# Patient Record
Sex: Female | Born: 1995 | Race: White | Hispanic: No | Marital: Single | Smoking: Never smoker
Health system: Southern US, Community
[De-identification: ages and names within clinical notes are randomized; demographics above are authoritative.]

## PROBLEM LIST (undated history)

## (undated) DIAGNOSIS — T7840XA Allergy, unspecified, initial encounter: Secondary | ICD-10-CM

## (undated) HISTORY — DX: Allergy, unspecified, initial encounter: T78.40XA

---
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
## 2016-01-18 ENCOUNTER — Telehealth: Payer: Self-pay | Admitting: Family Medicine

## 2016-01-18 ENCOUNTER — Encounter: Payer: Self-pay | Admitting: Family Medicine

## 2016-01-18 DIAGNOSIS — N390 Urinary tract infection, site not specified: Secondary | ICD-10-CM | POA: Insufficient documentation

## 2016-01-18 MED ORDER — NITROFURANTOIN MONOHYD MACRO 100 MG PO CAPS: 100.0000 mg | ORAL_CAPSULE | Freq: Two times a day (BID) | ORAL | 0 refills | Status: AC

## 2016-01-18 NOTE — Progress Notes (Signed)
We are sorry that you are not feeling well.  Here is how we plan to help!  Based on what you shared with me it looks like you most likely have a simple urinary tract infection.  A UTI (Urinary Tract Infection) is a bacterial infection of the bladder.  Most cases of urinary tract infections are simple to treat but a key part of your care is to encourage you to drink plenty of fluids and watch your symptoms carefully.  I have prescribed Macrobid 2 times a day x 7 days.  Your symptoms should gradually improve. Call us if the burning in your urine worsens, you develop worsening fever, back pain or pelvic pain or if your symptoms do not resolve after completing the antibiotic.  Urinary tract infections can be prevented by drinking plenty of water to keep your body hydrated.  Also be sure when you wipe, wipe from front to back and don't hold it in!  If possible, empty your bladder every 4 hours.  Your e-visit answers were reviewed by a board certified advanced clinical practitioner to complete your personal care plan.  Depending on the condition, your plan could have included both over the counter or prescription medications.  If there is a problem please reply  once you have received a response from your provider.  Your safety is important to us.  If you have drug allergies check your prescription carefully.    You can use MyChart to ask questions about today's visit, request a non-urgent call back, or ask for a work or school excuse for 24 hours related to this e-Visit. If it has been greater than 24 hours you will need to follow up with your provider, or enter a new e-Visit to address those concerns.   You will get an e-mail in the next two days asking about your experience.  I hope that your e-visit has been valuable and will speed your recovery. Thank you for using e-visits.

## 2016-01-29 ENCOUNTER — Encounter: Payer: Self-pay | Admitting: Nurse Practitioner

## 2016-01-29 ENCOUNTER — Ambulatory Visit (INDEPENDENT_AMBULATORY_CARE_PROVIDER_SITE_OTHER): Payer: 59 | Admitting: Nurse Practitioner

## 2016-01-29 VITALS — BP 112/72 | Ht 69.0 in | Wt 148.8 lb

## 2016-01-29 DIAGNOSIS — Z3041 Encounter for surveillance of contraceptive pills: Secondary | ICD-10-CM

## 2016-01-29 MED ORDER — NORGESTIM-ETH ESTRAD TRIPHASIC 0.18/0.215/0.25 MG-35 MCG PO TABS: 1.0000 | ORAL_TABLET | Freq: Every day | ORAL | 3 refills | Status: DC

## 2016-01-29 MED FILL — TRI-PREVIFEM TABLET: 0.18/0.215/ | 84 days supply | Qty: 84 | Fill #0

## 2016-01-29 NOTE — Progress Notes (Signed)
Subjective:  Presents for recheck for her birth control pills. Regular menses, normal flow. Denies any missed pills. No new sexual partners. Overall healthy diet. Limited activity.  Objective:   BP 112/72   Ht 5\' 9"  (1.753 m)   Wt 148 lb 12.8 oz (67.5 kg)   BMI 21.97 kg/m  NAD. Alert, oriented. Lungs clear. Heart regular rate rhythm.  Assessment: Encounter for surveillance of contraceptive pills  Plan:  Meds ordered this encounter  Medications  . Norgestimate-Ethinyl Estradiol Triphasic (TRINESSA, 28,) 0.18/0.215/0.25 MG-35 MCG tablet    Sig: Take 1 tablet by mouth daily.    Dispense:  3 Package    Refill:  3    Order Specific Question:   Supervising Provider    Answer:   Merlyn AlbertLUKING, WILLIAM S [[id number]]   Increase activity. Discussed safe sex issues. Defers need for STD testing. Return in about 1 year (around 01/28/2017) for physical.

## 2016-04-30 DIAGNOSIS — H5213 Myopia, bilateral: Secondary | ICD-10-CM | POA: Diagnosis not present

## 2016-04-30 MED FILL — TRI-PREVIFEM TABLET: 0.18/0.215/ | 84 days supply | Qty: 84 | Fill #1

## 2016-07-28 MED FILL — TRI-PREVIFEM TABLET: 0.18/0.215/ | 84 days supply | Qty: 84 | Fill #2

## 2016-08-01 DIAGNOSIS — S93431A Sprain of tibiofibular ligament of right ankle, initial encounter: Secondary | ICD-10-CM | POA: Diagnosis not present

## 2016-10-03 MED FILL — TRI-PREVIFEM TABLET: 0.18/0.215/ | 84 days supply | Qty: 84 | Fill #3

## 2017-01-20 ENCOUNTER — Telehealth: Payer: Self-pay | Admitting: Family Medicine

## 2017-01-20 ENCOUNTER — Other Ambulatory Visit: Payer: Self-pay | Admitting: Nurse Practitioner

## 2017-01-20 MED FILL — NORG-EE 0.18-0.215-0.25/0.0: 0.18/0.215/ | 84 days supply | Qty: 84 | Fill #0

## 2017-01-20 NOTE — Telephone Encounter (Signed)
Requesting refill on birth control to [facility].  Mom wants to make sure this is done ASAP because she will be out on Sunday and she has to send to her in [place].

## 2017-01-20 NOTE — Telephone Encounter (Signed)
Med was sent in by carolyn 2 mins before this call came in. Pt notified refill sent.

## 2017-01-30 ENCOUNTER — Ambulatory Visit (INDEPENDENT_AMBULATORY_CARE_PROVIDER_SITE_OTHER): Payer: 59 | Admitting: Nurse Practitioner

## 2017-01-30 ENCOUNTER — Telehealth: Payer: Self-pay | Admitting: *Deleted

## 2017-01-30 ENCOUNTER — Encounter: Payer: Self-pay | Admitting: Nurse Practitioner

## 2017-01-30 VITALS — BP 122/78 | Ht 69.75 in | Wt 151.0 lb

## 2017-01-30 DIAGNOSIS — Z113 Encounter for screening for infections with a predominantly sexual mode of transmission: Secondary | ICD-10-CM

## 2017-01-30 DIAGNOSIS — Z0000 Encounter for general adult medical examination without abnormal findings: Secondary | ICD-10-CM | POA: Diagnosis not present

## 2017-01-30 MED ORDER — NORETHIN-ETH ESTRAD-FE BIPHAS 1 MG-10 MCG / 10 MCG PO TABS: 1.0000 | ORAL_TABLET | Freq: Every day | ORAL | 2 refills | Status: DC

## 2017-01-30 NOTE — Telephone Encounter (Signed)
Fax from [facility]. Pt's insurance plan now requires sto therapy before covering LoLoestrin fe 1/10. Please see form. On carolyn's desk. Pt was seen today.

## 2017-01-30 NOTE — Telephone Encounter (Signed)
Please call on Monday and let her know we have to try another generic. She must try 2 within a year. There is another generic that is the same medication we talked about but a different dose. Let me know if she wants to try this.

## 2017-01-30 NOTE — Progress Notes (Signed)
   Subjective:    Patient ID: Dana Brewer, female    DOB: December 29, 1995, 21 y.o.   MRN: [id number]  HPI presents for her wellness exam. Overall healthy diet. Does not eat red meat. No missed birth control pills. Regular cycles, normal flow. Has had her current partner for the past 2 years. Has noticed some issues with lubrication and libido which she thinks may be related to her current birth control pill or possibly the generic version. Also under a lot more stress with school, currently in college courses. Regular vision and dental exams. Active lifestyle.  Depression screen PHQ 2/9 01/30/2017  Decreased Interest 0  Down, Depressed, Hopeless 0  PHQ - 2 Score 0      Review of Systems  Constitutional: Negative for activity change, appetite change and fatigue.  HENT: Negative for dental problem, ear pain, sinus pressure and sore throat.   Respiratory: Negative for cough, chest tightness, shortness of breath and wheezing.   Cardiovascular: Negative for chest pain.  Gastrointestinal: Negative for abdominal distention, abdominal pain, constipation, diarrhea, nausea and vomiting.  Genitourinary: Negative for difficulty urinating, dysuria, enuresis, frequency, genital sores, menstrual problem, pelvic pain, urgency and vaginal discharge.       Objective:   Physical Exam  Constitutional: She is oriented to person, place, and time. She appears well-developed. No distress.  HENT:  Right Ear: External ear normal.  Left Ear: External ear normal.  Mouth/Throat: Oropharynx is clear and moist.  Neck: Normal range of motion. Neck supple. No tracheal deviation present. No thyromegaly present.  Cardiovascular: Normal rate, regular rhythm and normal heart sounds.  Exam reveals no gallop.   No murmur heard. Pulmonary/Chest: Effort normal and breath sounds normal. Right breast exhibits no inverted nipple, no mass, no skin change and no tenderness. Left breast exhibits no inverted nipple, no mass, no  skin change and no tenderness. Breasts are symmetrical.  Abdominal: Soft. She exhibits no distension. There is no tenderness.  Genitourinary:  Genitourinary Comments: Defers GU exam, denies any problems.  Lymphadenopathy:    She has no cervical adenopathy.  Neurological: She is alert and oriented to person, place, and time.  Skin: Skin is warm and dry. No rash noted.  Psychiatric: She has a normal mood and affect. Her behavior is normal.  Vitals reviewed.  breast exam: Very dense tissue with multiple fine nodularity more so on the right, no dominant masses. Axillae no adenopathy.        Assessment & Plan:  Routine general medical examination at a health care facility  Screen for STD (sexually transmitted disease) - Plan: Chlamydia/Gonococcus/Trichomonas, NAA  Discussed options for decreased lubrication and libido. Switch to lo Loestrin start with next pack of pills. Use backup method first pack. Contact office if any problems with new pill. Reviewed safe sex issues. STD testing pending. Declines flu vaccine. Return in about 1 year (around 01/30/2018) for physical.

## 2017-02-01 LAB — CHLAMYDIA/GONOCOCCUS/TRICHOMONAS, NAA
CHLAMYDIA BY NAA: NEGATIVE
GONOCOCCUS BY NAA: NEGATIVE
TRICH VAG BY NAA: NEGATIVE

## 2017-02-01 LAB — SPECIMEN STATUS REPORT

## 2017-02-03 ENCOUNTER — Other Ambulatory Visit: Payer: Self-pay | Admitting: Nurse Practitioner

## 2017-02-03 MED ORDER — NORETHINDRONE ACET-ETHINYL EST 1-20 MG-MCG PO TABS: 1.0000 | ORAL_TABLET | Freq: Every day | ORAL | 11 refills | Status: DC

## 2017-02-03 NOTE — Telephone Encounter (Signed)
Sent to [facility]. Start with next pack. Use back up method such as condoms first pack. Call back if any problems.

## 2017-02-03 NOTE — Telephone Encounter (Signed)
Spoke with patient and informed her per Nathaneil Canary- Sent to [place]. Start with next pack. Use back up method such as condoms first pack. Call back if any problems. Patient verbalized understanding.

## 2017-02-03 NOTE — Telephone Encounter (Signed)
Spoke with patient and informed her that her insurance now requires step therapy before they will cover your LoLoestrin fe1/10.  Informed her per Nathaneil Canary that there is a generic that is the same medication that was discussed but a different dose. Patient verbalized understanding and stated that she would like to try that other medication. Please advise?

## 2017-02-03 NOTE — Telephone Encounter (Signed)
Left message return call 02/03/17 

## 2017-02-12 MED FILL — LO LOESTRIN FE 1-10 TABLET: 1 MG-10 MCG | 28 days supply | Qty: 28 | Fill #0

## 2017-05-14 ENCOUNTER — Telehealth: Payer: Self-pay | Admitting: Family Medicine

## 2017-05-14 DIAGNOSIS — Z0100 Encounter for examination of eyes and vision without abnormal findings: Secondary | ICD-10-CM

## 2017-05-14 NOTE — Telephone Encounter (Signed)
Once again, crazy, brendale you may want to explore if this is accurate this will mean a lot more work for us if true, lets do if needed

## 2017-05-14 NOTE — Telephone Encounter (Signed)
Pt needs referral documented for ophthalmology for a routine eye exam (pt has [facility] plan & referrals are required)  Please initiate in system so that I may process  [facility] [phone] ID# [id number], Group# [id number] (per pt - no card in system)

## 2017-05-15 NOTE — Telephone Encounter (Signed)
Referral ordered in Epic. 

## 2017-11-02 ENCOUNTER — Telehealth: Payer: No Typology Code available for payment source | Admitting: Family

## 2017-11-02 DIAGNOSIS — A499 Bacterial infection, unspecified: Secondary | ICD-10-CM

## 2017-11-02 DIAGNOSIS — N390 Urinary tract infection, site not specified: Secondary | ICD-10-CM | POA: Diagnosis not present

## 2017-11-02 MED ORDER — NITROFURANTOIN MONOHYD MACRO 100 MG PO CAPS: 100.0000 mg | ORAL_CAPSULE | Freq: Two times a day (BID) | ORAL | 0 refills | Status: DC

## 2017-11-02 NOTE — Progress Notes (Signed)

## 2017-12-28 ENCOUNTER — Telehealth: Payer: Self-pay | Admitting: Family Medicine

## 2017-12-28 MED ORDER — NORETHINDRONE ACET-ETHINYL EST 1-20 MG-MCG PO TABS: 1.0000 | ORAL_TABLET | Freq: Every day | ORAL | 3 refills | Status: DC

## 2017-12-28 NOTE — Telephone Encounter (Signed)
Pt needs birth control refilled - Has physical scheduled for 02/02/18 here with Lillia Abed  Can we send in a refill to cover pt until her physical?  Please advise & notify pt through MyChart - her cell service is only for texting at this time    [facility]

## 2017-12-28 NOTE — Telephone Encounter (Signed)
Please advise 

## 2017-12-28 NOTE — Telephone Encounter (Signed)
I called left message asked that she r/c. Medication sent in to the pharmacy requested.

## 2017-12-28 NOTE — Telephone Encounter (Signed)
She may have 3 refills. 

## 2017-12-28 NOTE — Telephone Encounter (Signed)
Patient notified

## 2018-02-02 ENCOUNTER — Encounter: Payer: Self-pay | Admitting: Family Medicine

## 2018-02-02 ENCOUNTER — Ambulatory Visit (INDEPENDENT_AMBULATORY_CARE_PROVIDER_SITE_OTHER): Payer: No Typology Code available for payment source | Admitting: Family Medicine

## 2018-02-02 VITALS — BP 122/86 | HR 98 | Ht 68.0 in | Wt 156.8 lb

## 2018-02-02 DIAGNOSIS — Z113 Encounter for screening for infections with a predominantly sexual mode of transmission: Secondary | ICD-10-CM | POA: Diagnosis not present

## 2018-02-02 DIAGNOSIS — Z01419 Encounter for gynecological examination (general) (routine) without abnormal findings: Secondary | ICD-10-CM | POA: Diagnosis not present

## 2018-02-02 DIAGNOSIS — N926 Irregular menstruation, unspecified: Secondary | ICD-10-CM

## 2018-02-02 LAB — POCT URINE PREGNANCY: PREG TEST UR: NEGATIVE

## 2018-02-02 MED ORDER — NORETHINDRONE ACET-ETHINYL EST 1-20 MG-MCG PO TABS: 1.0000 | ORAL_TABLET | Freq: Every day | ORAL | 9 refills | Status: DC

## 2018-02-02 NOTE — Progress Notes (Signed)
Subjective:    Patient ID: Dana Brewer, female    DOB: 01-21-1996, 22 y.o.   MRN: [id number]  HPI The patient comes in today for a wellness visit.  Goes to [employer], graduates in May.   A review of their health history was completed. A review of medications was also completed.  Any needed refills; Birth control   Eating habits: Health Conscious  Falls/  MVA accidents in past few months: none  Regular exercise: Volleyball, Facilities manager pt sees on regular basis: No specialists  Preventative health issues were discussed. Regular dental and vision.  Additional concerns: Irregular Periods, pt declined flu shot - with last cycle has had bleeding in between her cycles, LMP 12/22/17, has not had regular cycle since then, scheduled to have a cycle on 01/21/18. Reports compliance with OCPs, has had regular cycles previously. Sexually active with long term partner, no other form of birth control used. Denies vaginal discharge or itching.   Review of Systems  Constitutional: Negative for chills, fatigue, fever and unexpected weight change.  HENT: Negative for congestion, ear pain, sinus pressure, sinus pain and sore throat.   Eyes: Negative for discharge and visual disturbance.  Respiratory: Negative for cough, shortness of breath and wheezing.   Cardiovascular: Negative for chest pain and leg swelling.  Gastrointestinal: Negative for abdominal pain, blood in stool, constipation, diarrhea, nausea and vomiting.  Genitourinary: Positive for menstrual problem. Negative for difficulty urinating and hematuria.  Skin: Negative for color change.  Neurological: Negative for dizziness, weakness, light-headedness and headaches.  Hematological: Negative for adenopathy.  Psychiatric/Behavioral: Negative for suicidal ideas.  All other systems reviewed and are negative.      Objective:   Physical Exam  Constitutional: She is oriented to person, place, and time.  She appears well-developed and well-nourished. No distress.  HENT:  Head: Normocephalic and atraumatic.  Right Ear: Tympanic membrane normal.  Left Ear: Tympanic membrane normal.  Nose: Nose normal.  Mouth/Throat: Uvula is midline and oropharynx is clear and moist.  Eyes: Pupils are equal, round, and reactive to light. Conjunctivae and EOM are normal. Right eye exhibits no discharge. Left eye exhibits no discharge.  Neck: Neck supple. No thyromegaly present.  Cardiovascular: Normal rate, regular rhythm and normal heart sounds.  No murmur heard. Pulmonary/Chest: Effort normal and breath sounds normal. No respiratory distress. She has no wheezes. Right breast exhibits no inverted nipple, no mass, no nipple discharge, no skin change and no tenderness. Left breast exhibits no inverted nipple, no mass, no nipple discharge, no skin change and no tenderness.  Abdominal: Soft. Bowel sounds are normal. She exhibits no distension and no mass. There is no tenderness.  Genitourinary: Vagina normal and uterus normal. There is no rash, tenderness, lesion or injury on the right labia. There is no rash, tenderness, lesion or injury on the left labia. Cervix exhibits discharge and friability. Cervix exhibits no motion tenderness. Right adnexum displays no mass and no tenderness. Left adnexum displays no mass and no tenderness.  Musculoskeletal: She exhibits no edema or deformity.  Lymphadenopathy:    She has no cervical adenopathy.  Neurological: She is alert and oriented to person, place, and time. Coordination normal.  Skin: Skin is warm and dry.  Psychiatric: She has a normal mood and affect. Her behavior is normal. Judgment and thought content normal.  Nursing note and vitals reviewed.     Assessment & Plan:  1. Well woman exam Adult wellness-complete.wellness physical was conducted today.  Importance of diet and exercise were discussed in detail.  In addition to this a discussion regarding safety was  also covered. We also reviewed over immunizations and gave recommendations regarding current immunization needed for age.   -Declines HPV vaccine, info given  -Declines Flu In addition to this additional areas were also touched on including: Preventative health exams needed:  Pap Smear done today  Patient was advised yearly wellness exam  Screen for STD (sexually transmitted disease) - Will send urine for GC/Chlamydia/Trich testing; Pap IG w/ reflex to HPV when ASC-U  2. Irregular menses - Plan: POCT urine pregnancy  Urine pregnancy negative today. Awaiting results of GC/Chlamydia/Trich screening, will notify patient of results. Will continue with current OCP, refills given, and pt will notify us if she has continued BTB or irregular cycles while on the pill. At that time may consider a different OCP or patient may be interested in [facility], which we would place a referral for at that time. As attending physician to this patient visit, this patient was seen in conjunction with the nurse practitioner.  The history,physical and treatment plan was reviewed with the nurse practitioner and pertinent findings were verified with the patient.  Also the treatment plan was reviewed with the patient while they were present. [facility]

## 2018-02-05 LAB — PAP IG W/ RFLX HPV ASCU: PAP SMEAR COMMENT: 0

## 2018-02-06 LAB — CHLAMYDIA/GONOCOCCUS/TRICHOMONAS, NAA
Chlamydia by NAA: NEGATIVE
Gonococcus by NAA: NEGATIVE
Trich vag by NAA: NEGATIVE

## 2018-04-20 ENCOUNTER — Telehealth: Payer: No Typology Code available for payment source | Admitting: Physician Assistant

## 2018-04-20 DIAGNOSIS — R319 Hematuria, unspecified: Secondary | ICD-10-CM

## 2018-04-20 DIAGNOSIS — N390 Urinary tract infection, site not specified: Secondary | ICD-10-CM | POA: Diagnosis not present

## 2018-04-20 MED ORDER — CEPHALEXIN 500 MG PO CAPS: 500.0000 mg | ORAL_CAPSULE | Freq: Two times a day (BID) | ORAL | 0 refills | Status: AC

## 2018-04-20 NOTE — Progress Notes (Signed)
We are sorry that you are not feeling well.  Here is how we plan to help!  Based on what you shared with me it looks like you most likely have a simple urinary tract infection.  A UTI (Urinary Tract Infection) is a bacterial infection of the bladder.  Most cases of urinary tract infections are simple to treat but a key part of your care is to encourage you to drink plenty of fluids and watch your symptoms carefully.  I have prescribed Keflex 500 mg twice a day for 7 days.  Your symptoms should gradually improve. Call us if the burning in your urine worsens, you develop worsening fever, back pain or pelvic pain or if your symptoms do not resolve after completing the antibiotic.  Urinary tract infections can be prevented by drinking plenty of water to keep your body hydrated.  Also be sure when you wipe, wipe from front to back and don't hold it in!  If possible, empty your bladder every 4 hours.  Your e-visit answers were reviewed by a board certified advanced clinical practitioner to complete your personal care plan.  Depending on the condition, your plan could have included both over the counter or prescription medications.  If there is a problem please reply once you have received a response from your provider.  Your safety is important to us.  If you have drug allergies check your prescription carefully.    You can use MyChart to ask questions about today's visit, request a non-urgent call back, or ask for a work or school excuse for 24 hours related to this e-Visit. If it has been greater than 24 hours you will need to follow up with your provider, or enter a new e-Visit to address those concerns.   You will get an e-mail in the next two days asking about your experience.  I hope that your e-visit has been valuable and will speed your recovery. Thank you for using e-visits.    ===View-only below this line===   ----- Message -----    From: Dana MediaJennifer N Brewer    Sent: 04/20/2018  9:44  AM EST      To: E-Visit Mailing List Subject: E-Visit Submission: Urinary Problems  E-Visit Submission: Urinary Problems --------------------------------  Question: Which of the following are you experiencing? Answer:   Pain while passing urine  Question: When you have pain when passing urine, which of these apply? Answer:   The pain feels like it is on the inside  Question: Are you able to pass urine? Answer:   Yes, I can pass urine with difficulty  Question: How long have you had pain or difficulty passing urine? Answer:   Two days or less  Question: Do you have a fever? Answer:   I do not know  Question: Do you have any of the following? Answer:   I have back pain with this illness  Question: Do you have an exaggerated sensation of the need to pass urine? Answer:   Yes, the sensation is exaggerated  Question: Do you have the urge to urinate more of less frequently than normal? Answer:   The same as normal  Question: What does your urine look like? Answer:   It is clear  Question: Do you have any of the following? Answer:   Dark red or dark brown urine            An unusual smell  Question: Do you have any of the following? Answer:   No  discharge  Question: Do you have any sores on your genitals? Answer:   No  Question: Do you have any history of kidney dysfunction or kidney problems? Answer:   No  Question: Within the past 3 months, have you had any surgery on your kidneys or bladder, or have you had a tube inserted to collect your urine? Answer:   No, I have never had either  Question: Have you had similar symptoms in the past? Answer:   Yes, I have had similar symptoms more than once before  Question: If you had similar symptoms in the past, did any of the following work? Answer:   Pills for urine infection  Question: Please list any additional comments  Answer:     Question: Please list your medication allergies that you may have ? (If 'none' , please  list as 'none') Answer:   none  Question: Are you pregnant? Answer:   I am confident that I am not pregnant  Question: Are you breastfeeding? Answer:   No

## 2018-11-01 ENCOUNTER — Telehealth: Payer: No Typology Code available for payment source | Admitting: Physician Assistant

## 2018-11-01 ENCOUNTER — Encounter: Payer: Self-pay | Admitting: Physician Assistant

## 2018-11-01 DIAGNOSIS — N390 Urinary tract infection, site not specified: Secondary | ICD-10-CM

## 2018-11-01 MED ORDER — NITROFURANTOIN MONOHYD MACRO 100 MG PO CAPS: 100.0000 mg | ORAL_CAPSULE | Freq: Two times a day (BID) | ORAL | 0 refills | Status: DC

## 2018-11-01 NOTE — Progress Notes (Signed)
We are sorry that you are not feeling well.  Here is how we plan to help!  Based on what you shared with me it looks like you most likely have a simple urinary tract infection.  A UTI (Urinary Tract Infection) is a bacterial infection of the bladder.  Most cases of urinary tract infections are simple to treat but a key part of your care is to encourage you to drink plenty of fluids and watch your symptoms carefully.  I have prescribed MacroBid 100 mg twice a day for 5 days.  Your symptoms should gradually improve. Call us if the burning in your urine worsens, you develop worsening fever, back pain or pelvic pain or if your symptoms do not resolve after completing the antibiotic.  Urinary tract infections can be prevented by drinking plenty of water to keep your body hydrated.  Also be sure when you wipe, wipe from front to back and don't hold it in!  If possible, empty your bladder every 4 hours.  As per your reply, you have denied new onset back pain with this UTI. New onset back pain with UTI symptoms can indicate kidney infection which would require different treatment that outlined here.   Your e-visit answers were reviewed by a board certified advanced clinical practitioner to complete your personal care plan.  Depending on the condition, your plan could have included both over the counter or prescription medications.  If there is a problem please reply  once you have received a response from your provider.  Your safety is important to [place].  If you have drug allergies check your prescription carefully.    You can use MyChart to ask questions about today's visit, request a non-urgent call back, or ask for a work or school excuse for 24 hours related to this e-Visit. If it has been greater than 24 hours you will need to follow up with your provider, or enter a new e-Visit to address those concerns.   You will get an e-mail in the next two days asking about your experience.  I hope that your  e-visit has been valuable and will speed your recovery. Thank you for using e-visits.   I spent 5-10 minutes on review and completion of this note- Lacy Duverney [facility]

## 2018-12-13 ENCOUNTER — Encounter: Payer: Self-pay | Admitting: Physician Assistant

## 2018-12-13 ENCOUNTER — Telehealth: Payer: No Typology Code available for payment source | Admitting: Physician Assistant

## 2018-12-13 ENCOUNTER — Other Ambulatory Visit: Payer: Self-pay

## 2018-12-13 ENCOUNTER — Ambulatory Visit
Admission: EM | Admit: 2018-12-13 | Discharge: 2018-12-13 | Disposition: A | Discharge status: Home / Self Care | Payer: No Typology Code available for payment source | Attending: Emergency Medicine | Admitting: Emergency Medicine

## 2018-12-13 DIAGNOSIS — Z87440 Personal history of urinary (tract) infections: Secondary | ICD-10-CM | POA: Diagnosis present

## 2018-12-13 DIAGNOSIS — N390 Urinary tract infection, site not specified: Secondary | ICD-10-CM

## 2018-12-13 DIAGNOSIS — N3001 Acute cystitis with hematuria: Secondary | ICD-10-CM | POA: Insufficient documentation

## 2018-12-13 LAB — POCT URINALYSIS DIP (MANUAL ENTRY)
Bilirubin, UA: NEGATIVE
Glucose, UA: NEGATIVE mg/dL
Ketones, POC UA: NEGATIVE mg/dL
Nitrite, UA: POSITIVE — AB
Protein Ur, POC: 30 mg/dL — AB
Spec Grav, UA: >=1.03 — AB (ref 1.010–1.025)
Urobilinogen, UA: 1 E.U./dL
pH, UA: 6.5 (ref 5.0–8.0)

## 2018-12-13 LAB — POCT URINE PREGNANCY: Preg Test, Ur: NEGATIVE

## 2018-12-13 MED ORDER — CEPHALEXIN 500 MG PO CAPS: 500.0000 mg | ORAL_CAPSULE | Freq: Two times a day (BID) | ORAL | 0 refills | Status: AC

## 2018-12-13 NOTE — ED Triage Notes (Signed)
uti symptoms began 2 days ago

## 2018-12-13 NOTE — Progress Notes (Signed)
Based on what you shared with me, I feel your condition warrants further evaluation and I recommend that you be seen for a face to face office visit.  NOTE: If you entered your credit card information for this eVisit, you will not be charged. You may see a "hold" on your card for the $35 but that hold will drop off and you will not have a charge processed.  Ms. Dana Brewer,  Review of your records shows that you had similar symptoms recently. Recurrent UTIs warrant for a face to face evaluation. Please follow up with your doctor.  If you are having a true medical emergency please call 911.     For an urgent face to face visit, [place] has five urgent care centers for your convenience:   [email or web address] to reserve your spot online an avoid wait times  [place], [place], [place] [address] *Just off [address], across the road from [place] hours of operation: Monday-Friday, 12 PM to 6 PM  Closed Saturday & Sunday    . [facility]    [phone]                  Get Driving Directions  [address], [place] [address] . 10 am to 8 pm Monday-Friday . 12 pm to 8 pm Saturday-Sunday   . [facility] at [place]                  Get Driving Directions  [address], [place], [place] [address] . 8 am to 8 pm Monday-Friday . 9 am to 6 pm Saturday . 11 am to 6 pm Sunday   . [facility] at [place]                  Get Driving Directions   [address], [place] [address] . 8 am to 8 pm Monday-Friday . 8 am to 4 pm Saturday-Sunday    . [facility] at [place]                    Get Driving Directions  [phone]  [address] [place], [place] [address]  . Monday-Friday, 12 PM to 6 PM    Your e-visit answers were reviewed by a board  certified advanced clinical practitioner to complete your personal care plan.  Thank you for using e-Visits. I spent 5-10 minutes on review and completion of this note- Lacy Duverney [facility]

## 2018-12-13 NOTE — ED Provider Notes (Signed)
[facility]    CSN: [id number] Arrival date & time: 12/13/18  1544      History   Chief Complaint Chief Complaint  Patient presents with  . Urinary Tract Infection    HPI Dana Brewer is a 23 y.o. female.   HPI Dana Brewer is a 23 y.o. female presenting to UC with c/o 2 days of urinary frequency, urgency and dysuria. Mild bladder spasms.  Denies fever, chills, n/v/d. Denies vaginal symptoms. No concern for STIs. Last UTI was about 1 month ago. She was on Macrobid at that time. She has had several UTIs over the last few months. She attempted to do an e-visit today but due to recurrent infections, she was advised to have a face-to-face exam.  Past Medical History:  Diagnosis Date  . Allergy     Patient Active Problem List   Diagnosis Date Noted  . UTI (urinary tract infection) 01/18/2016  . Menorrhagia 01/28/2013    History reviewed. No pertinent surgical history.  OB History   No obstetric history on file.      Home Medications    Prior to Admission medications   Medication Sig Start Date End Date Taking? Authorizing Provider  cephALEXin (KEFLEX) 500 MG capsule Take 1 capsule (500 mg total) by mouth 2 (two) times daily for 7 days. 12/13/18 12/20/18  Lurene ShadowPhelps, Tisheena Maguire O, PA-C  nitrofurantoin, macrocrystal-monohydrate, (MACROBID) 100 MG capsule Take 1 capsule (100 mg total) by mouth 2 (two) times daily. 11/01/18   Demetrio Lappingsman, Sahar M, PA-C  norethindrone-ethinyl estradiol (MICROGESTIN,JUNEL,LOESTRIN) 1-20 MG-MCG tablet Take 1 tablet by mouth daily. 02/02/18   Jeannine BogaWeekley, Lindsay, NP    Family History History reviewed. No pertinent family history.  Social History Social History   Tobacco Use  . Smoking status: Never Smoker  . Smokeless tobacco: Never Used  Substance Use Topics  . Alcohol use: Not on file  . Drug use: Not on file     Allergies   Patient has no known allergies.   Review of Systems Review of Systems  Constitutional: Negative for  chills and fever.  Gastrointestinal: Negative for abdominal pain, diarrhea, nausea and vomiting.  Genitourinary: Positive for dysuria, frequency and urgency. Negative for pelvic pain.  Musculoskeletal: Negative for back pain.  Neurological: Negative for dizziness, light-headedness and headaches.     Physical Exam Triage Vital Signs ED Triage Vitals  Enc Vitals Group     BP 12/13/18 1555 120/79     Pulse Rate 12/13/18 1555 80     Resp 12/13/18 1555 18     Temp 12/13/18 1555 98 F (36.7 C)     Temp src --      SpO2 12/13/18 1555 96 %     Weight --      Height --      Head Circumference --      Peak Flow --      Pain Score 12/13/18 1554 5     Pain Loc --      Pain Edu? --      Excl. in GC? --    No data found.  Updated Vital Signs BP 120/79   Pulse 80   Temp 98 F (36.7 C)   Resp 18   LMP 11/19/2018 (Approximate)   SpO2 96%   Visual Acuity Right Eye Distance:   Left Eye Distance:   Bilateral Distance:    Right Eye Near:   Left Eye Near:    Bilateral Near:  Physical Exam Vitals signs and nursing note reviewed.  Constitutional:      General: She is not in acute distress.    Appearance: Normal appearance. She is well-developed. She is not ill-appearing.  HENT:     Head: Normocephalic and atraumatic.     Mouth/Throat:     Mouth: Mucous membranes are moist.  Neck:     Musculoskeletal: Normal range of motion.  Cardiovascular:     Rate and Rhythm: Normal rate and regular rhythm.  Pulmonary:     Effort: Pulmonary effort is normal.     Breath sounds: Normal breath sounds.  Abdominal:     General: There is no distension.     Palpations: Abdomen is soft.     Tenderness: There is no abdominal tenderness. There is no right CVA tenderness or left CVA tenderness.  Musculoskeletal: Normal range of motion.  Skin:    General: Skin is warm and dry.  Neurological:     Mental Status: She is alert and oriented to person, place, and time.  Psychiatric:         Behavior: Behavior normal.      UC Treatments / Results  Labs (all labs ordered are listed, but only abnormal results are displayed) Labs Reviewed  POCT URINALYSIS DIP (MANUAL ENTRY) - Abnormal; Notable for the following components:      Result Value   Color, UA straw (*)    Clarity, UA cloudy (*)    Spec Grav, UA >=1.030 (*)    Blood, UA moderate (*)    Protein Ur, POC =30 (*)    Nitrite, UA Positive (*)    Leukocytes, UA Moderate (2+) (*)    All other components within normal limits  URINE CULTURE  POCT URINE PREGNANCY    EKG   Radiology No results found.  Procedures Procedures (including critical care time)  Medications Ordered in UC Medications - No data to display  Initial Impression / Assessment and Plan / UC Course  I have reviewed the triage vital signs and the nursing notes.  Pertinent labs & imaging results that were available during my care of the patient were reviewed by me and considered in my medical decision making (see chart for details).     UA c/w UTI Culture sent Pt was most recently on macrobid, will start pt on Keflex while culture pending AVS provided  Final Clinical Impressions(s) / UC Diagnoses   Final diagnoses:  Acute cystitis with hematuria  History of recurrent UTIs     Discharge Instructions      Please take your antibiotic as prescribed. A urine culture has been sent to check the severity of your urinary infection and to determine if you are on the most appropriate antibiotic. The results should come back within 2-3 days and you will be notified if a medication change is indicated based off these results.   Please stay well hydrated and follow up with your family doctor in 1 week if not improving, sooner if worsening.     ED Prescriptions    Medication Sig Dispense Auth. Provider   cephALEXin (KEFLEX) 500 MG capsule Take 1 capsule (500 mg total) by mouth 2 (two) times daily for 7 days. 14 capsule Noe Gens, PA-C      Controlled Substance Prescriptions [place] Controlled Substance Registry consulted? Not Applicable   Tyrell Antonio 12/13/18 1624

## 2018-12-13 NOTE — Discharge Instructions (Signed)
°  Please take your antibiotic as prescribed. A urine culture has been sent to check the severity of your urinary infection and to determine if you are on the most appropriate antibiotic. The results should come back within 2-3 days and you will be notified if a medication change is indicated based off these results.   Please stay well hydrated and follow up with your family doctor in 1 week if not improving, sooner if worsening.

## 2018-12-23 LAB — SUSCEPTIBILITY RESULT

## 2018-12-23 LAB — SUSCEPTIBILITY, AER + ANAEROB

## 2018-12-27 LAB — URINE CULTURE: Culture: >=100000 — AB

## 2019-02-09 ENCOUNTER — Other Ambulatory Visit: Payer: Self-pay

## 2019-02-09 ENCOUNTER — Ambulatory Visit (INDEPENDENT_AMBULATORY_CARE_PROVIDER_SITE_OTHER): Payer: No Typology Code available for payment source | Admitting: Family Medicine

## 2019-02-09 VITALS — BP 116/76 | Temp 98.3°F | Ht 68.0 in | Wt 160.4 lb

## 2019-02-09 DIAGNOSIS — Z01419 Encounter for gynecological examination (general) (routine) without abnormal findings: Secondary | ICD-10-CM

## 2019-02-09 DIAGNOSIS — Z0000 Encounter for general adult medical examination without abnormal findings: Secondary | ICD-10-CM

## 2019-02-09 MED ORDER — NORETHINDRONE ACET-ETHINYL EST 1-20 MG-MCG PO TABS: 1.0000 | ORAL_TABLET | Freq: Every day | ORAL | 11 refills | Status: DC

## 2019-02-09 NOTE — Progress Notes (Signed)
   Subjective:    Patient ID: Dana Brewer, female    DOB: 12-25-1995, 23 y.o.   MRN: [id number]  HPI  The patient comes in today for a wellness visit.    A review of their health history was completed.  A review of medications was also completed.  Any needed refills; yes on birth control pills  Eating habits: healthy  Falls/  MVA accidents in past few months: none  Regular exercise: loves outdoors, hiking  tryig to exrcise   Like to be active   Non smoker  Specialist pt sees on regular basis: no  Preventative health issues were discussed.   Additional concerns: none  Review of Systems  Constitutional: Negative for activity change, appetite change and fatigue.  HENT: Negative for congestion and rhinorrhea.   Eyes: Negative for discharge.  Respiratory: Negative for cough, chest tightness and wheezing.   Cardiovascular: Negative for chest pain.  Gastrointestinal: Negative for abdominal pain, blood in stool and vomiting.  Endocrine: Negative for polyphagia.  Genitourinary: Negative for difficulty urinating and frequency.  Musculoskeletal: Negative for neck pain.  Skin: Negative for color change.  Allergic/Immunologic: Negative for environmental allergies and food allergies.  Neurological: Negative for weakness and headaches.  Psychiatric/Behavioral: Negative for agitation and behavioral problems.  All other systems reviewed and are negative.      Objective:   Physical Exam Vitals signs reviewed.  Constitutional:      Appearance: She is well-developed.  HENT:     Head: Normocephalic.     Right Ear: External ear normal.     Left Ear: External ear normal.  Eyes:     Pupils: Pupils are equal, round, and reactive to light.  Neck:     Musculoskeletal: Normal range of motion.     Thyroid: No thyromegaly.  Cardiovascular:     Rate and Rhythm: Normal rate and regular rhythm.     Heart sounds: Normal heart sounds. No murmur.  Pulmonary:     Effort:  Pulmonary effort is normal. No respiratory distress.     Breath sounds: Normal breath sounds. No wheezing.  Abdominal:     General: Bowel sounds are normal. There is no distension.     Palpations: Abdomen is soft. There is no mass.     Tenderness: There is no abdominal tenderness.  Musculoskeletal: Normal range of motion.        General: No tenderness.  Lymphadenopathy:     Cervical: No cervical adenopathy.  Skin:    General: Skin is warm and dry.  Neurological:     Mental Status: She is alert and oriented to person, place, and time.     Motor: No abnormal muscle tone.  Psychiatric:        Behavior: Behavior normal.           Assessment & Plan:  Impression well young adult exam.  Diet discussed.  Exercise discussed.  Anticipatory guidance given.  Vaccines discussed and administered where appropriate.  Birth control compliance discussed.  Tolerating well.  Will refill 1 years worth.

## 2019-02-11 ENCOUNTER — Encounter: Payer: Self-pay | Admitting: Family Medicine

## 2019-03-09 ENCOUNTER — Other Ambulatory Visit: Payer: Self-pay | Admitting: Nurse Practitioner

## 2019-03-09 ENCOUNTER — Telehealth: Payer: No Typology Code available for payment source | Admitting: Nurse Practitioner

## 2019-03-09 ENCOUNTER — Ambulatory Visit (INDEPENDENT_AMBULATORY_CARE_PROVIDER_SITE_OTHER): Payer: No Typology Code available for payment source | Admitting: Family Medicine

## 2019-03-09 ENCOUNTER — Other Ambulatory Visit: Payer: Self-pay

## 2019-03-09 DIAGNOSIS — Z20822 Contact with and (suspected) exposure to covid-19: Secondary | ICD-10-CM

## 2019-03-09 DIAGNOSIS — I889 Nonspecific lymphadenitis, unspecified: Secondary | ICD-10-CM

## 2019-03-09 DIAGNOSIS — Z20828 Contact with and (suspected) exposure to other viral communicable diseases: Secondary | ICD-10-CM | POA: Diagnosis not present

## 2019-03-09 DIAGNOSIS — J029 Acute pharyngitis, unspecified: Secondary | ICD-10-CM

## 2019-03-09 MED ORDER — CEFPROZIL 500 MG PO TABS: ORAL_TABLET | ORAL | 0 refills | Status: DC

## 2019-03-09 NOTE — Progress Notes (Signed)
   Subjective:  Audio plus video  Patient ID: Dana Brewer, female    DOB: 1996/04/04, 23 y.o.   MRN: [id number]  Sore Throat  This is a new problem. The current episode started yesterday. Associated symptoms comments: Hard to swallow, lymph nodes swollen on both sides (right side is worse). Treatments tried: gargling with salt water, cough drops. The treatment provided mild relief.   Virtual Visit via Video Note  I connected with Lucretia Pendley Mastrianni on 03/09/19 at  3:50 PM EST by a video enabled telemedicine application and verified that I am speaking with the correct person using two identifiers.  Location: Patient: home Provider: office   I discussed the limitations of evaluation and management by telemedicine and the availability of in person appointments. The patient expressed understanding and agreed to proceed.  History of Present Illness:    Observations/Objective:   Assessment and Plan:   Follow Up Instructions:    I discussed the assessment and treatment plan with the patient. The patient was provided an opportunity to ask questions and all were answered. The patient agreed with the plan and demonstrated an understanding of the instructions.   The patient was advised to call back or seek an in-person evaluation if the symptoms worsen or if the condition fails to improve as anticipated.  I provided 18 minutes of non-face-to-face time during this encounter.   Vicente Males, LPN  Patient does work at [employer].  Works in the lab.  There has been some cases of Covid  Notes very significant painful throat.  Also swelling and tenderness of the glands and from the throat.  No high fevers no vomiting no diarrhea no headaches no chest pain.  Slight clearing of throat and congestion  Review of Systems See above    Objective:   Physical Exam   Virtual     Assessment & Plan:  Pharyngitis with secondary lymphadenitis.  Plan antibiotics prescribed.  Symptom care  discussed warning signs discussed Covid testing strongly encouraged

## 2019-03-09 NOTE — Progress Notes (Signed)

## 2019-03-10 ENCOUNTER — Other Ambulatory Visit: Payer: Self-pay

## 2019-03-10 DIAGNOSIS — Z20822 Contact with and (suspected) exposure to covid-19: Secondary | ICD-10-CM

## 2019-03-12 LAB — NOVEL CORONAVIRUS, NAA: SARS-CoV-2, NAA: NOT DETECTED

## 2019-03-15 ENCOUNTER — Telehealth: Payer: Self-pay | Admitting: *Deleted

## 2019-03-15 NOTE — Telephone Encounter (Signed)
Results of covid test available in epic

## 2019-05-18 ENCOUNTER — Encounter: Payer: Self-pay | Admitting: Family Medicine

## 2019-05-19 ENCOUNTER — Encounter: Payer: Self-pay | Admitting: Family Medicine

## 2020-01-06 ENCOUNTER — Other Ambulatory Visit: Payer: Self-pay | Admitting: *Deleted

## 2020-01-06 NOTE — Telephone Encounter (Signed)
Left message to return call 

## 2020-01-09 ENCOUNTER — Telehealth: Payer: Self-pay | Admitting: Family Medicine

## 2020-01-09 MED ORDER — NORETHINDRONE ACET-ETHINYL EST 1-20 MG-MCG PO TABS: 1.0000 | ORAL_TABLET | Freq: Every day | ORAL | 0 refills | Status: DC

## 2020-01-09 NOTE — Telephone Encounter (Signed)
Left message to schedule physical

## 2020-01-09 NOTE — Telephone Encounter (Signed)
Patient is requesting refill on birthcontrol in [facility]

## 2020-01-09 NOTE — Telephone Encounter (Signed)
Patient needs to schedule appointment prior to refill.

## 2020-01-09 NOTE — Telephone Encounter (Signed)
Patient scheduled physical for 10/5 and needing refill for this month on birthcontrol

## 2020-01-09 NOTE — Telephone Encounter (Signed)
Please advise. Thank you

## 2020-01-10 NOTE — Telephone Encounter (Signed)
Medication sent to pharmacy; left detailed message on voicemail

## 2020-01-24 ENCOUNTER — Ambulatory Visit (INDEPENDENT_AMBULATORY_CARE_PROVIDER_SITE_OTHER): Payer: No Typology Code available for payment source | Admitting: Family Medicine

## 2020-01-24 ENCOUNTER — Encounter: Payer: Self-pay | Admitting: Family Medicine

## 2020-01-24 ENCOUNTER — Other Ambulatory Visit: Payer: Self-pay

## 2020-01-24 ENCOUNTER — Telehealth: Payer: Self-pay | Admitting: Family Medicine

## 2020-01-24 VITALS — BP 120/76 | HR 77 | Temp 97.5°F | Ht 70.0 in | Wt 153.0 lb

## 2020-01-24 DIAGNOSIS — Z0000 Encounter for general adult medical examination without abnormal findings: Secondary | ICD-10-CM

## 2020-01-24 DIAGNOSIS — Z113 Encounter for screening for infections with a predominantly sexual mode of transmission: Secondary | ICD-10-CM | POA: Diagnosis not present

## 2020-01-24 DIAGNOSIS — Z30019 Encounter for initial prescription of contraceptives, unspecified: Secondary | ICD-10-CM | POA: Diagnosis not present

## 2020-01-24 DIAGNOSIS — Z23 Encounter for immunization: Secondary | ICD-10-CM

## 2020-01-24 DIAGNOSIS — Z3002 Counseling and instruction in natural family planning to avoid pregnancy: Secondary | ICD-10-CM | POA: Insufficient documentation

## 2020-01-24 MED ORDER — NORETHINDRONE ACET-ETHINYL EST 1-20 MG-MCG PO TABS: 1.0000 | ORAL_TABLET | Freq: Every day | ORAL | 11 refills | Status: AC

## 2020-01-24 MED ORDER — NORETHINDRONE ACET-ETHINYL EST 1-20 MG-MCG PO TABS: 1.0000 | ORAL_TABLET | Freq: Every day | ORAL | 11 refills | Status: DC

## 2020-01-24 NOTE — Telephone Encounter (Signed)
Fax from pharmacy asking if pt birth control(Loestrin) needs to be for 21 or 28 tablets. 1/20 comes in the 21 tablets and 28s are the FE containing generic. Please advise. Thank you

## 2020-01-24 NOTE — Progress Notes (Signed)
Patient ID: Dana Brewer, female    DOB: 30-Oct-1995, 24 y.o.   MRN: [id number]   Chief Complaint  Patient presents with  . Annual Exam   Subjective:  CC: wellness exam.   HPI The patient comes in today for a wellness visit. No concerns. Sexually active with one female partner. Last PAP 02/02/18. Declines pelvic exam today.     A review of their health history was completed.  A review of medications was also completed.  Any needed refills; birth control  Eating habits: health conscious  Falls/  MVA accidents in past few months: none  Regular exercise: hiking and occasionally the gym  Specialist pt sees on regular basis: none  Preventative health issues were discussed.   Additional concerns: would like a flu vaccine   Medical History Dana Brewer has a past medical history of Allergy.   Outpatient Encounter Medications as of 01/24/2020  Medication Sig  . norethindrone-ethinyl estradiol (LOESTRIN) 1-20 MG-MCG tablet Take 1 tablet by mouth daily.  . [DISCONTINUED] norethindrone-ethinyl estradiol (LOESTRIN) 1-20 MG-MCG tablet Take 1 tablet by mouth daily.  . [DISCONTINUED] norethindrone-ethinyl estradiol (LOESTRIN) 1-20 MG-MCG tablet Take 1 tablet by mouth daily.  . [DISCONTINUED] cefPROZIL (CEFZIL) 500 MG tablet Take one tablet po BID for 10 days   No facility-administered encounter medications on file as of 01/24/2020.     Review of Systems  Constitutional: Negative.   HENT: Negative.   Eyes: Negative.   Respiratory: Negative.   Cardiovascular: Negative.   Gastrointestinal: Negative.   Endocrine: Negative.   Genitourinary: Negative.   Musculoskeletal: Negative.   Skin: Negative.   Allergic/Immunologic: Negative.   Neurological: Negative.   Hematological: Negative.   Psychiatric/Behavioral: Negative.      Vitals BP 120/76   Pulse 77   Temp (!) 97.5 F (36.4 C)   Ht 5\' 10"  (1.778 m)   Wt 153 lb (69.4 kg)   LMP 01/10/2020 (Approximate)   SpO2 100%    BMI 21.95 kg/m   Objective:   Physical Exam Vitals and nursing note reviewed.  Constitutional:      Appearance: Normal appearance.  HENT:     Right Ear: Tympanic membrane normal.     Left Ear: Tympanic membrane normal.     Nose: Nose normal.     Mouth/Throat:     Mouth: Mucous membranes are moist.     Pharynx: Oropharynx is clear.  Eyes:     Pupils: Pupils are equal, round, and reactive to light.  Cardiovascular:     Rate and Rhythm: Normal rate and regular rhythm.     Heart sounds: Normal heart sounds.  Pulmonary:     Effort: Pulmonary effort is normal.     Breath sounds: Normal breath sounds.  Chest:     Chest wall: No tenderness.     Breasts:        Right: Normal.        Left: Normal.     Comments: Permission given to perform breast exam. Abdominal:     General: Bowel sounds are normal.     Palpations: Abdomen is soft.     Tenderness: There is no abdominal tenderness. There is no guarding.  Musculoskeletal:        General: Normal range of motion.  Lymphadenopathy:     Upper Body:     Right upper body: No axillary adenopathy.     Left upper body: No axillary adenopathy.  Skin:    General: Skin is warm and dry.  Neurological:     Mental Status: She is alert and oriented to person, place, and time.  Psychiatric:        Mood and Affect: Mood normal.        Behavior: Behavior normal.        Thought Content: Thought content normal.        Judgment: Judgment normal.      Assessment and Plan   1. Routine general medical examination at a health care facility - Chlamydia/Gonococcus/Trichomonas, NAA  2. Need for vaccination - Flu Vaccine QUAD 6+ mos PF IM (Fluarix Quad PF)  3. Screen for STD (sexually transmitted disease) - Chlamydia/Gonococcus/Trichomonas, NAA  4. Encounter for female birth control - norethindrone-ethinyl estradiol (LOESTRIN) 1-20 MG-MCG tablet; Take 1 tablet by mouth daily.  Dispense: 21 tablet; Refill: 11   Agreed to STI testing per  guidelines.  No risky behaviors identified. Will notify of lab results once available.  Agrees with plan of care discussed today. Understands warning signs to seek further care: changes in health status.  Understands to follow-up in one year, sooner if needed.       Dorena Bodo, FNP-C 01/24/2020

## 2020-01-24 NOTE — Patient Instructions (Signed)
Preventive Care 21-23 Years Old, Female °Preventive care refers to visits with your health care provider and lifestyle choices that can promote health and wellness. This includes: °· A yearly physical exam. This may also be called an annual well check. °· Regular dental visits and eye exams. °· Immunizations. °· Screening for certain conditions. °· Healthy lifestyle choices, such as eating a healthy diet, getting regular exercise, not using drugs or products that contain nicotine and tobacco, and limiting alcohol use. °What can I expect for my preventive care visit? °Physical exam °Your health care provider will check your: °· Height and weight. This may be used to calculate body mass index (BMI), which tells if you are at a healthy weight. °· Heart rate and blood pressure. °· Skin for abnormal spots. °Counseling °Your health care provider may ask you questions about your: °· Alcohol, tobacco, and drug use. °· Emotional well-being. °· Home and relationship well-being. °· Sexual activity. °· Eating habits. °· Work and work environment. °· Method of birth control. °· Menstrual cycle. °· Pregnancy history. °What immunizations do I need? ° °Influenza (flu) vaccine °· This is recommended every year. °Tetanus, diphtheria, and pertussis (Tdap) vaccine °· You may need a Td booster every 10 years. °Varicella (chickenpox) vaccine °· You may need this if you have not been vaccinated. °Human papillomavirus (HPV) vaccine °· If recommended by your health care provider, you may need three doses over 6 months. °Measles, mumps, and rubella (MMR) vaccine °· You may need at least one dose of MMR. You may also need a second dose. °Meningococcal conjugate (MenACWY) vaccine °· One dose is recommended if you are age 19-21 years and a first-year college student living in a residence hall, or if you have one of several medical conditions. You may also need additional booster doses. °Pneumococcal conjugate (PCV13) vaccine °· You may need  this if you have certain conditions and were not previously vaccinated. °Pneumococcal polysaccharide (PPSV23) vaccine °· You may need one or two doses if you smoke cigarettes or if you have certain conditions. °Hepatitis A vaccine °· You may need this if you have certain conditions or if you travel or work in places where you may be exposed to hepatitis A. °Hepatitis B vaccine °· You may need this if you have certain conditions or if you travel or work in places where you may be exposed to hepatitis B. °Haemophilus influenzae type b (Hib) vaccine °· You may need this if you have certain conditions. °You may receive vaccines as individual doses or as more than one vaccine together in one shot (combination vaccines). Talk with your health care provider about the risks and benefits of combination vaccines. °What tests do I need? ° °Blood tests °· Lipid and cholesterol levels. These may be checked every 5 years starting at age 20. °· Hepatitis C test. °· Hepatitis B test. °Screening °· Diabetes screening. This is done by checking your blood sugar (glucose) after you have not eaten for a while (fasting). °· Sexually transmitted disease (STD) testing. °· BRCA-related cancer screening. This may be done if you have a family history of breast, ovarian, tubal, or peritoneal cancers. °· Pelvic exam and Pap test. This may be done every 3 years starting at age 21. Starting at age 30, this may be done every 5 years if you have a Pap test in combination with an HPV test. °Talk with your health care provider about your test results, treatment options, and if necessary, the need for more tests. °  Follow these instructions at home: °Eating and drinking ° °· Eat a diet that includes fresh fruits and vegetables, whole grains, lean protein, and low-fat dairy. °· Take vitamin and mineral supplements as recommended by your health care provider. °· Do not drink alcohol if: °? Your health care provider tells you not to drink. °? You are  pregnant, may be pregnant, or are planning to become pregnant. °· If you drink alcohol: °? Limit how much you have to 0-1 drink a day. °? Be aware of how much alcohol is in your drink. In the [place] one drink equals one 12 oz bottle of beer (355 mL), one 5 oz glass of wine (148 mL), or one 1½ oz glass of hard liquor (44 mL). °Lifestyle °· Take daily care of your teeth and gums. °· Stay active. Exercise for at least 30 minutes on 5 or more days each week. °· Do not use any products that contain nicotine or tobacco, such as cigarettes, e-cigarettes, and chewing tobacco. If you need help quitting, ask your health care provider. °· If you are sexually active, practice safe sex. Use a condom or other form of birth control (contraception) in order to prevent pregnancy and STIs (sexually transmitted infections). If you plan to become pregnant, see your health care provider for a preconception visit. °What's next? °· Visit your health care provider once a year for a well check visit. °· Ask your health care provider how often you should have your eyes and teeth checked. °· Stay up to date on all vaccines. °This information is not intended to replace advice given to you by your health care provider. Make sure you discuss any questions you have with your health care provider. °Document Revised: 12/17/2017 Document Reviewed: 12/17/2017 °Elsevier Patient Education © 2020 [employer] ° °

## 2020-01-27 LAB — CHLAMYDIA/GONOCOCCUS/TRICHOMONAS, NAA
Chlamydia by NAA: NEGATIVE
Gonococcus by NAA: NEGATIVE
Trich vag by NAA: NEGATIVE

## 2020-01-27 LAB — SPECIMEN STATUS REPORT

## 2021-07-02 ENCOUNTER — Telehealth: Payer: No Typology Code available for payment source | Admitting: Nurse Practitioner

## 2021-07-02 DIAGNOSIS — H9209 Otalgia, unspecified ear: Secondary | ICD-10-CM

## 2021-07-03 NOTE — Progress Notes (Signed)
Based on what you shared with me it looks like you have ear infection or just fluid behind er drum.,that should be evaluated in a face to face office visit. You need someone to look at it for proper treatment. ? ?NOTE: There will be NO CHARGE for this eVisit ?  ?If you are having a true medical emergency please call 911.   ?  ? For an urgent face to face visit, [facility] has six urgent care centers for your convenience:  ?  ? [facility] ?Get Driving Directions ?[phone] ?[phone] ?[place], [place] [address] ?  ? [facility]) ?Get Driving Directions ?[phone] ?[address] ?[place], [place] [address] ? ?[facility]) ?Get Driving Directions ?[phone] ?[address] ?[place],  [place]  [address] ? ?[facility] ?Get Driving Directions ?[phone] ?[address], [address] ?[place], [place] [address] ?  ?[facility] [place] ?Get Driving Directions  ?[phone] ?[address] ?[address] ?[place], [place] [address] ?  ?[facility] ?Get Driving Directions ?[phone] ?[address] ?[place], [place] [address] ? ?Your MyChart E-visit questionnaire answers were reviewed by a board certified advanced clinical practitioner to complete your personal care plan based on your specific symptoms.  Thank you for using e-Visits. ?  ? ?

## 2021-12-05 ENCOUNTER — Ambulatory Visit: Payer: No Typology Code available for payment source | Admitting: Nurse Practitioner
